# Patient Record
Sex: Male | Born: 1995 | Race: White | Hispanic: No | Marital: Single | Smoking: Never smoker
Health system: Southern US, Community
[De-identification: ages and names within clinical notes are randomized; demographics above are authoritative.]

## PROBLEM LIST (undated history)

## (undated) DIAGNOSIS — I10 Essential (primary) hypertension: Secondary | ICD-10-CM

## (undated) DIAGNOSIS — R030 Elevated blood-pressure reading, without diagnosis of hypertension: Secondary | ICD-10-CM

## (undated) HISTORY — DX: Essential (primary) hypertension: I10

## (undated) HISTORY — PX: KNEE SURGERY: SHX244

## (undated) HISTORY — DX: Elevated blood-pressure reading, without diagnosis of hypertension: R03.0

---
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
## 2016-04-27 DIAGNOSIS — H40023 Open angle with borderline findings, high risk, bilateral: Secondary | ICD-10-CM | POA: Diagnosis not present

## 2016-05-12 DIAGNOSIS — H40023 Open angle with borderline findings, high risk, bilateral: Secondary | ICD-10-CM | POA: Diagnosis not present

## 2016-06-03 DIAGNOSIS — M25562 Pain in left knee: Secondary | ICD-10-CM

## 2016-06-03 DIAGNOSIS — M6750 Plica syndrome, unspecified knee: Secondary | ICD-10-CM

## 2016-06-03 HISTORY — DX: Pain in left knee: M25.562

## 2016-06-03 HISTORY — DX: Plica syndrome, unspecified knee: M67.50

## 2016-06-24 DIAGNOSIS — M2242 Chondromalacia patellae, left knee: Secondary | ICD-10-CM

## 2016-06-24 HISTORY — DX: Chondromalacia patellae, left knee: M22.42

## 2017-04-25 DIAGNOSIS — H40023 Open angle with borderline findings, high risk, bilateral: Secondary | ICD-10-CM | POA: Diagnosis not present

## 2017-09-29 ENCOUNTER — Other Ambulatory Visit: Payer: Self-pay | Admitting: Family Medicine

## 2017-09-29 DIAGNOSIS — R030 Elevated blood-pressure reading, without diagnosis of hypertension: Secondary | ICD-10-CM | POA: Diagnosis not present

## 2017-09-29 DIAGNOSIS — I10 Essential (primary) hypertension: Secondary | ICD-10-CM

## 2017-09-29 DIAGNOSIS — Z6822 Body mass index (BMI) 22.0-22.9, adult: Secondary | ICD-10-CM | POA: Diagnosis not present

## 2017-10-11 DIAGNOSIS — I701 Atherosclerosis of renal artery: Secondary | ICD-10-CM | POA: Diagnosis not present

## 2017-10-17 ENCOUNTER — Ambulatory Visit
Admission: RE | Admit: 2017-10-17 | Discharge: 2017-10-17 | Disposition: A | Discharge status: Home / Self Care | Payer: BLUE CROSS/BLUE SHIELD | Source: Ambulatory Visit | Attending: Family Medicine | Admitting: Family Medicine

## 2017-10-17 ENCOUNTER — Other Ambulatory Visit: Payer: Self-pay

## 2017-10-17 DIAGNOSIS — I10 Essential (primary) hypertension: Secondary | ICD-10-CM

## 2017-10-17 DIAGNOSIS — I701 Atherosclerosis of renal artery: Secondary | ICD-10-CM | POA: Diagnosis not present

## 2017-10-21 ENCOUNTER — Telehealth: Payer: Self-pay | Admitting: Cardiovascular Disease

## 2017-10-21 ENCOUNTER — Encounter: Payer: Self-pay | Admitting: Cardiovascular Disease

## 2017-10-21 NOTE — Telephone Encounter (Signed)
Called pt and left message for pt to call back to update Family and Medical History.

## 2017-10-24 ENCOUNTER — Encounter: Payer: Self-pay | Admitting: Cardiovascular Disease

## 2017-10-24 ENCOUNTER — Ambulatory Visit (INDEPENDENT_AMBULATORY_CARE_PROVIDER_SITE_OTHER): Payer: BLUE CROSS/BLUE SHIELD | Admitting: Cardiovascular Disease

## 2017-10-24 VITALS — BP 140/78 | HR 48 | Ht 74.0 in | Wt 172.8 lb

## 2017-10-24 DIAGNOSIS — I10 Essential (primary) hypertension: Secondary | ICD-10-CM | POA: Diagnosis not present

## 2017-10-24 DIAGNOSIS — I119 Hypertensive heart disease without heart failure: Secondary | ICD-10-CM | POA: Diagnosis not present

## 2017-10-24 DIAGNOSIS — Z13220 Encounter for screening for lipoid disorders: Secondary | ICD-10-CM | POA: Diagnosis not present

## 2017-10-24 LAB — URINALYSIS
Bilirubin, UA: NEGATIVE
Glucose, UA: NEGATIVE
KETONES UA: NEGATIVE
LEUKOCYTES UA: NEGATIVE
Nitrite, UA: NEGATIVE
PH UA: 5.5 (ref 5.0–7.5)
PROTEIN UA: NEGATIVE
RBC UA: NEGATIVE
Urobilinogen, Ur: 1 mg/dL (ref 0.2–1.0)

## 2017-10-24 LAB — HEPATIC FUNCTION PANEL
ALBUMIN: 4.8 g/dL (ref 3.5–5.5)
ALT: 15 IU/L (ref 0–44)
AST: 26 IU/L (ref 0–40)
Alkaline Phosphatase: 92 IU/L (ref 39–117)
Bilirubin Total: 0.9 mg/dL (ref 0.0–1.2)
Bilirubin, Direct: 0.26 mg/dL (ref 0.00–0.40)
TOTAL PROTEIN: 7.6 g/dL (ref 6.0–8.5)

## 2017-10-24 LAB — CBC
HEMOGLOBIN: 15.1 g/dL (ref 13.0–17.7)
Hematocrit: 43.2 % (ref 37.5–51.0)
MCH: 31.7 pg (ref 26.6–33.0)
MCHC: 35 g/dL (ref 31.5–35.7)
MCV: 91 fL (ref 79–97)
Platelets: 297 10*3/uL (ref 150–450)
RBC: 4.77 x10E6/uL (ref 4.14–5.80)
RDW: 12.7 % (ref 12.3–15.4)
WBC: 5.7 10*3/uL (ref 3.4–10.8)

## 2017-10-24 LAB — LIPID PANEL
Chol/HDL Ratio: 2.3 ratio (ref 0.0–5.0)
Cholesterol, Total: 120 mg/dL (ref 100–199)
HDL: 53 mg/dL (ref >39)
LDL Calculated: 55 mg/dL (ref 0–99)
Triglycerides: 59 mg/dL (ref 0–149)
VLDL CHOLESTEROL CAL: 12 mg/dL (ref 5–40)

## 2017-10-24 LAB — BASIC METABOLIC PANEL
BUN/Creatinine Ratio: 21 — ABNORMAL HIGH (ref 9–20)
BUN: 20 mg/dL (ref 6–20)
CHLORIDE: 104 mmol/L (ref 96–106)
CO2: 23 mmol/L (ref 20–29)
Calcium: 9.9 mg/dL (ref 8.7–10.2)
Creatinine, Ser: 0.96 mg/dL (ref 0.76–1.27)
GFR calc Af Amer: 131 mL/min/{1.73_m2} (ref >59)
GFR calc non Af Amer: 113 mL/min/{1.73_m2} (ref >59)
GLUCOSE: 72 mg/dL (ref 65–99)
Potassium: 4.1 mmol/L (ref 3.5–5.2)
SODIUM: 143 mmol/L (ref 134–144)

## 2017-10-24 LAB — TSH: TSH: 1.26 u[IU]/mL (ref 0.450–4.500)

## 2017-10-24 NOTE — Patient Instructions (Addendum)
Medication Instructions:  Your physician recommends that you continue on your current medications as directed. Please refer to the Current Medication list given to you today.   Labwork: TODAY - cholesterol, liver panel, TSH, CBC, basic metabolic panel,urinalysis   Testing/Procedures: Your physician has requested that you have an echocardiogram. Echocardiography is a painless test that uses sound waves to create images of your heart. It provides your doctor with information about the size and shape of your heart and how well your heart's chambers and valves are working. This procedure takes approximately one hour. There are no restrictions for this procedure.   Follow-Up: Your physician recommends that you return for a follow-up appointment on Tuesday July 16 at 7:40 am with Dr. Elease HashimotoNahser   If you need a refill on your cardiac medications before your next appointment, please call your pharmacy.   Thank you for choosing [facility]! Eligha BridegroomMichelle Geeta Dworkin, RN [phone]

## 2017-10-24 NOTE — Progress Notes (Signed)
Cardiology Office Note:    Date:  10/24/2017   ID:  Jesus [name], DOB [birth year], MRN [id number]  PCP:  Lewis Moccasinewey, Elizabeth R, MD  Cardiologist:  Kristeen MissPhilip Teller Wakefield, MD   Referring MD: Lewis Moccasinewey, Elizabeth R, MD   Problem List: 1. HTN   Chief Complaint  Patient presents with  . Hypertension    History of Present Illness:    Jesus [name] is a 22 y.o. male with no significant past medical history.  He was recently found to have hypertension. 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 Able to run well  .  Eats an unrestricted diet .   Eats a fairly high salt diet .  Drinks gatoraid.    Does not take supplements  Limited caffiene,    Non smoker   He had a renal ultrasound which was normal.  Is not had any blood work. Has not had any blood work yet.  No symptoms of Pheo,   Or Seritonin excess No gout   Past Medical History:  Diagnosis Date  . Elevated blood pressure reading   . Hypertension     Past Surgical History:  Procedure Laterality Date  . KNEE SURGERY      Current Medications: No outpatient medications have been marked as taking for the 10/24/17 encounter (Office Visit) with Syona Wroblewski, Deloris PingPhilip J, MD.     Allergies:   Patient has no known allergies.   Social History   Socioeconomic History  . Marital status: Single    Spouse name: Not on file  . Number of children: Not on file  . Years of education: Not on file  . Highest education level: Not on file  Occupational History  . Not on file  Social Needs  . Financial resource strain: Not on file  . Food insecurity:    Worry: Not on file    Inability: Not on file  . Transportation needs:    Medical: Not on file    Non-medical: Not on file  Tobacco Use  . Smoking status: Never Smoker  . Smokeless tobacco: Never Used  Substance and Sexual Activity    . Alcohol use: Not on file  . Drug use: Not on file  . Sexual activity: Not on file  Lifestyle  . Physical activity:    Days per week: Not on file    Minutes per session: Not on file  . Stress: Not on file  Relationships  . Social connections:    Talks on phone: Not on file    Gets together: Not on file    Attends religious service: Not on file    Active member of club or organization: Not on file    Attends meetings of clubs or organizations: Not on file    Relationship status: Not on file  Other Topics Concern  . Not on file  Social History Narrative  . Not on file     Family History: The patient's family history includes Hypertension in his father.  ROS:   Please see the history of present illness.     All other systems reviewed and are negative.  EKGs/Labs/Other Studies Reviewed:      EKG:  October 24, 2017 - marked sinus brady with sinus arrhythmia   . HR 48  Recent Labs: 10/24/2017: ALT 15; BUN 20; Creatinine, Ser 0.96; Hemoglobin 15.1; Platelets 297; Potassium 4.1; Sodium 143; TSH 1.260  Recent Lipid Panel    Component Value Date/Time   CHOL 120 10/24/2017 1149   TRIG 59 10/24/2017 1149   HDL 53 10/24/2017 1149   CHOLHDL 2.3 10/24/2017 1149   LDLCALC 55 10/24/2017 1149    Physical Exam:    VS:  BP 140/78   Pulse (!) 48   Ht 6\' 2"  (1.88 m)   Wt 172 lb 12.8 oz (78.4 kg)   SpO2 98%   BMI 22.19 kg/m     Wt Readings from Last 3 Encounters:  10/24/17 172 lb 12.8 oz (78.4 kg)     GEN: healthy young male  HEENT: Normal NECK: No JVD; No carotid bruits LYMPHATICS: No lymphadenopathy CARDIAC: RR   RESPIRATORY:  Clear to auscultation without rales, wheezing or rhonchi  ABDOMEN: Soft, non-tender, non-distended MUSCULOSKELETAL:  No edema; No deformity  SKIN: Warm and dry NEUROLOGIC:  Alert and oriented x 3 PSYCHIATRIC:  Normal affect   ASSESSMENT:    1. Essential hypertension   2. 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 Hypertension: Jesus [name] is a healthy 22 year old gentleman.  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  He has had mildly elevated blood pressure and has had a little bit of difficulty in passing his physical exam to run track and cross-country.  A renal artery ultrasound which was normal.  He has not had any blood work or other work-up.  We will get initial blood work including CBC with differential, TSH, basic metabolic profile, liver enzymes and lipid profile.  We will also probably get a urinalysis.  He does not have any signs or symptoms of pheochromocytoma  He admits to eating a little bit more salt than he probably should.  We will have him limit his salt intake.  We will reassess in 3 to 4 weeks for follow-up visit.  I would like to get an echocardiogram to evaluate his LV size and function.  Will have him see me or an APP in 3-4 weeks for follow up visit    Medication Adjustments/Labs and Tests Ordered: Current medicines are reviewed at length with the patient today.  Concerns regarding medicines are outlined above.  Orders Placed This Encounter  Procedures  . Urinalysis  . Basic Metabolic Panel (BMET)  . Hepatic function panel  . CBC  . Lipid Profile  . TSH  . EKG 12-Lead  . ECHOCARDIOGRAM COMPLETE   No orders of the defined types were placed in this encounter.    Patient Instructions  Medication Instructions:  Your physician recommends that you continue on your current medications as directed. Please refer to the Current Medication list given to you today.   Labwork: TODAY - cholesterol, liver panel, TSH, CBC, basic metabolic panel,urinalysis   Testing/Procedures: Your physician has requested that you have an echocardiogram. Echocardiography is a painless test that uses sound waves to create images of your heart. It provides your doctor with information about the size and shape  of your heart and how well your heart's chambers and valves are working. This procedure takes approximately one hour. There are no restrictions for this procedure.   Follow-Up: Your physician recommends that you return for a follow-up appointment on Tuesday July 16 at 7:40 am with Dr. Elease Hashimoto  If you need a refill on your cardiac medications before your next appointment, please call your pharmacy.   Thank you for choosing [facility]! Eligha Bridegroom, RN [phone]       Signed, Kristeen Miss, MD  10/24/2017 8:26 PM    [facility]

## 2017-10-25 NOTE — Addendum Note (Signed)
Addended by: Levi AlandSWINYER, Nate Perri M on: 10/25/2017 01:17 PM   Modules accepted: Orders

## 2017-10-27 ENCOUNTER — Other Ambulatory Visit: Payer: Self-pay

## 2017-10-27 ENCOUNTER — Ambulatory Visit (HOSPITAL_COMMUNITY): Payer: BLUE CROSS/BLUE SHIELD | Attending: Cardiology

## 2017-10-27 DIAGNOSIS — I340 Nonrheumatic mitral (valve) insufficiency: Secondary | ICD-10-CM | POA: Diagnosis not present

## 2017-10-27 DIAGNOSIS — Z8249 Family history of ischemic heart disease and other diseases of the circulatory system: Secondary | ICD-10-CM | POA: Insufficient documentation

## 2017-10-27 DIAGNOSIS — I119 Hypertensive heart disease without heart failure: Secondary | ICD-10-CM | POA: Insufficient documentation

## 2017-11-22 ENCOUNTER — Encounter: Payer: Self-pay | Admitting: Cardiovascular Disease

## 2017-11-22 ENCOUNTER — Encounter (INDEPENDENT_AMBULATORY_CARE_PROVIDER_SITE_OTHER): Payer: Self-pay

## 2017-11-22 ENCOUNTER — Ambulatory Visit (INDEPENDENT_AMBULATORY_CARE_PROVIDER_SITE_OTHER): Payer: BLUE CROSS/BLUE SHIELD | Admitting: Cardiovascular Disease

## 2017-11-22 VITALS — BP 146/72 | HR 63 | Ht 74.0 in | Wt 172.0 lb

## 2017-11-22 DIAGNOSIS — I10 Essential (primary) hypertension: Secondary | ICD-10-CM

## 2017-11-22 MED ORDER — LOSARTAN POTASSIUM 50 MG PO TABS: 50.0000 mg | ORAL_TABLET | Freq: Every day | ORAL | 3 refills | Status: DC

## 2017-11-22 NOTE — Patient Instructions (Addendum)
Medication Instructions:  Your physician has recommended you make the following change in your medication:   START Losartan 50 mg once daily (start with 1/2 tab - 25 mg - then increase to 50 mg)   Labwork: None Ordered   Testing/Procedures: None Ordered   Follow-Up: Your physician recommends that you schedule a follow-up appointment in: 3 months with Dr. Elease HashimotoNahser - call to schedule based on school schedule  Your physician recommends that you return for a follow-up appointment for BP check on Monday August 12 at 11:00 am   If you need a refill on your cardiac medications before your next appointment, please call your pharmacy.   Thank you for choosing [facility]! Eligha BridegroomMichelle Swinyer, RN [phone]

## 2017-11-22 NOTE — Progress Notes (Signed)
Cardiology Office Note:    Date:  11/22/2017   ID:  Crecencio McFrederick W III Keiffer, DOB [birth year], MRN [id number]  PCP:  Lewis Moccasinewey, Elizabeth R, MD  Cardiologist:  Kristeen MissPhilip Donika Butner, MD   Referring MD: Lewis Moccasinewey, Elizabeth R, MD   Problem List: 1. HTN   Chief Complaint  Patient presents with  . Hypertension        Crecencio McFrederick W III Mcclure is a 22 y.o. male with no significant past medical history.  He was recently found to have hypertension. 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 Able to run well  .  Eats an unrestricted diet .   Eats a fairly high salt diet .  Drinks gatoraid.    Does not take supplements  Limited caffiene,    Non smoker   He had a renal ultrasound which was normal.  Is not had any blood work. Has not had any blood work yet.  No symptoms of Pheo,   Or Seritonin excess No gout   November 22, 2017:  Merlyn AlbertFred is seen today for follow up of his HTN I saw him last month.  We did not start any new medications.  We had him keep track of his blood pressure and to really watch his diet.  Doing well Drinking more water BP at home 120's - 140's  No headaches      Past Medical History:  Diagnosis Date  . Elevated blood pressure reading   . Hypertension     Past Surgical History:  Procedure Laterality Date  . KNEE SURGERY      Current Medications: No outpatient medications have been marked as taking for the 11/22/17 encounter (Office Visit) with Corey Laski, Deloris PingPhilip J, MD.     Allergies:   Patient has no known allergies.   Social History   Socioeconomic History  . Marital status: Single    Spouse name: Not on file  . Number of children: Not on file  . Years of education: Not on file  . Highest education level: Not on file  Occupational History  . Not on file  Social Needs  . Financial resource strain: Not on file  .  Food insecurity:    Worry: Not on file    Inability: Not on file  . Transportation needs:    Medical: Not on file    Non-medical: Not on file  Tobacco Use  . Smoking status: Never Smoker  . Smokeless tobacco: Never Used  Substance and Sexual Activity  . Alcohol use: Not on file  . Drug use: Not on file  . Sexual activity: Not on file  Lifestyle  . Physical activity:    Days per week: Not on file    Minutes per session: Not on file  . Stress: Not on file  Relationships  . Social connections:    Talks on phone: Not on file    Gets together: Not on file    Attends religious service: Not on file    Active member of club or organization: Not on file    Attends meetings of clubs or organizations: Not on file    Relationship status: Not on file  Other Topics Concern  . Not on file  Social History Narrative  . Not on file     Family  History: The patient's family history includes Hypertension in his father.  ROS:   Please see the history of present illness.     All other systems reviewed and are negative.  EKGs/Labs/Other Studies Reviewed:      EKG:     Recent Labs: 10/24/2017: ALT 15; BUN 20; Creatinine, Ser 0.96; Hemoglobin 15.1; Platelets 297; Potassium 4.1; Sodium 143; TSH 1.260  Recent Lipid Panel    Component Value Date/Time   CHOL 120 10/24/2017 1149   TRIG 59 10/24/2017 1149   HDL 53 10/24/2017 1149   CHOLHDL 2.3 10/24/2017 1149   LDLCALC 55 10/24/2017 1149    Physical Exam:    Physical Exam: Blood pressure (!) 146/72, pulse 63, height 6\' 2"  (1.88 m), weight 172 lb (78 kg), SpO2 98 %.  GEN:  Thin, young man .  Well nourished, well developed in no acute distress HEENT: Normal NECK: No JVD; No carotid bruits LYMPHATICS: No lymphadenopathy CARDIAC: RR  RESPIRATORY:  Clear to auscultation without rales, wheezing or rhonchi  ABDOMEN: Soft, non-tender, non-distended MUSCULOSKELETAL:  No edema; No deformity  SKIN: Warm and dry NEUROLOGIC:  Alert and  oriented x 3   ASSESSMENT:    1. Essential hypertension    PLAN:       1. Hypertension: Rudell Cobb is a healthy 22 year old gentleman.  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  He has had mildly elevated blood pressure and has had a little bit of difficulty in passing his physical exam to run track and cross-country.  A renal artery ultrasound which was normal.  He has not had any blood work or other work-up.  Will start Losartan 50 mg a day  - he will start with 1/2 tablet a day  Record BP daily  nusrse visit and BMP in 3 weeks  Will see him in 3 months or so ( when he is home on a school break)     Medication Adjustments/Labs and Tests Ordered: Current medicines are reviewed at length with the patient today.  Concerns regarding medicines are outlined above.  Orders Placed This Encounter  Procedures  . Basic Metabolic Panel (BMET)   Meds ordered this encounter  Medications  . losartan (COZAAR) 50 MG tablet    Sig: Take 1 tablet (50 mg total) by mouth daily.    Dispense:  90 tablet    Refill:  3      Patient Instructions  Medication Instructions:  Your physician has recommended you make the following change in your medication:   START Losartan 50 mg once daily (start with 1/2 tab - 25 mg - then increase to 50 mg)   Labwork: None Ordered   Testing/Procedures: None Ordered   Follow-Up: Your physician recommends that you schedule a follow-up appointment in: 3 months with Dr. Elease Hashimoto - call to schedule based on school schedule  Your physician recommends that you return for a follow-up appointment for BP check on Thursday August 8 at 11:00 am   If you need a refill on your cardiac medications before your next appointment, please call your pharmacy.   Thank you for choosing [facility]! Eligha Bridegroom, RN [phone]       Signed, Kristeen Miss, MD  11/22/2017 8:05 AM    [facility]

## 2017-11-23 ENCOUNTER — Telehealth: Payer: Self-pay | Admitting: Cardiovascular Disease

## 2017-11-23 NOTE — Telephone Encounter (Signed)
Spoke with pt's father who states pt was c/o of chest tightness. Pt started his new rx of Losartan last night. He states his son denies SOB, dizziness, diaphoresis, and fatigue. His father states it could be muscle related; his son ran 9 miles yesterday in the heat. I encouraged pt's father to be sure his son stays hydrated and take a tylenol to see if it helps to relieve his chest tightness. I urged pt's father to seek medical attention for his son if pt has sustained CP not relieved by rest. Pt's father agreed to plan and verbalized understanding.

## 2017-11-23 NOTE — Telephone Encounter (Signed)
New Message     Pt c/o of Chest Pain: STAT if CP now or developed within 24 hours  1. Are you having CP right now? No, more of a tightness/Per patient's father  2. Are you experiencing any other symptoms (ex. SOB, nausea, vomiting, sweating)? No/per patient father 3. How long have you been experiencing CP? Last night/per patient's father  4. Is your CP continuous or coming and going? No, patient ran 9 miles last night/per patient's father  5. Have you taken Nitroglycerin? No, per patient's father.   Patient was prescribe "Losartan" Patient's father is calling and he states b/p was good 118/62 this morning. Patient is currently at work.  ?

## 2017-11-23 NOTE — Telephone Encounter (Signed)
Agree that this is likely exercise, / heat related.  Record BP daily . Call us or 911 if CP persists

## 2017-11-24 NOTE — Telephone Encounter (Signed)
Called patient to see how he is feeling today and he reports he is feeling better. He states he does not have any chest pain today. I advised him to continue with good diet, and to be especially mindful when he is running long distances. He verbalized understanding and thanked me for the call.

## 2017-12-15 ENCOUNTER — Ambulatory Visit: Payer: BLUE CROSS/BLUE SHIELD

## 2017-12-19 ENCOUNTER — Other Ambulatory Visit: Payer: BLUE CROSS/BLUE SHIELD | Admitting: *Deleted

## 2017-12-19 ENCOUNTER — Ambulatory Visit (INDEPENDENT_AMBULATORY_CARE_PROVIDER_SITE_OTHER): Payer: BLUE CROSS/BLUE SHIELD | Admitting: Nurse Practitioner

## 2017-12-19 VITALS — BP 128/72 | HR 44 | Ht 74.0 in | Wt 168.5 lb

## 2017-12-19 DIAGNOSIS — I10 Essential (primary) hypertension: Secondary | ICD-10-CM

## 2017-12-19 LAB — BASIC METABOLIC PANEL
BUN / CREAT RATIO: 18 (ref 9–20)
BUN: 17 mg/dL (ref 6–20)
CO2: 25 mmol/L (ref 20–29)
CREATININE: 0.95 mg/dL (ref 0.76–1.27)
Calcium: 9.8 mg/dL (ref 8.7–10.2)
Chloride: 101 mmol/L (ref 96–106)
GFR calc non Af Amer: 115 mL/min/{1.73_m2} (ref >59)
GFR, EST AFRICAN AMERICAN: 133 mL/min/{1.73_m2} (ref >59)
GLUCOSE: 85 mg/dL (ref 65–99)
Potassium: 4.4 mmol/L (ref 3.5–5.2)
Sodium: 139 mmol/L (ref 134–144)

## 2017-12-19 NOTE — Progress Notes (Signed)
1.) Reason for visit: BP check  2.) Name of MD requesting visit: Dr. Elease HashimotoNahser  3.) H&P: Patient presents for BP check since starting Losartan 7/16. Patient was advised to start Losartan 25 mg for a few days and then increase dose to 50 mg. He is also here for BMET  4.) ROS related to problem: Patient reports he has continue Losartan 25 mg and is getting BP readings at home of 110-130 mmHg systolic. He states he feels well with is vigorous running routine. He denies concerns  5.) Assessment and plan per MD: Continue Losartan 25 mg and monitor BP regularly. Patient states he has a BP monitor which he will take to school with him. He is returning to college on Friday. Advised him he may increase Losartan to 50 mg daily and call back to report. Will call patient with results of BMET and will adjust plan of care if there are abnormal findings.

## 2017-12-19 NOTE — Patient Instructions (Addendum)
Medication Instructions:  Your physician recommends that you continue on your current medications as directed. Please refer to the Current Medication list given to you today.   Labwork: Done Today - will call you with results   Testing/Procedures: None Ordered   Follow-Up: Your physician recommends that you schedule a follow-up appointment in: 3 months with Dr. Elease HashimotoNahser - patient will call to schedule based on Thanksgiving Break  If you need a refill on your cardiac medications before your next appointment, please call your pharmacy.   Thank you for choosing [facility]! Eligha BridegroomMichelle Fannie Gathright, RN [phone]

## 2018-12-27 ENCOUNTER — Other Ambulatory Visit: Payer: Self-pay | Admitting: Cardiovascular Disease

## 2018-12-27 MED ORDER — LOSARTAN POTASSIUM 25 MG PO TABS: 25.0000 mg | ORAL_TABLET | Freq: Every day | ORAL | 0 refills | Status: DC

## 2018-12-27 NOTE — Telephone Encounter (Signed)
Pt's medication was sent to pt's pharmacy as requested. Confirmation received.  °

## 2019-01-19 ENCOUNTER — Other Ambulatory Visit: Payer: Self-pay | Admitting: Cardiovascular Disease

## 2019-02-14 DIAGNOSIS — M7662 Achilles tendinitis, left leg: Secondary | ICD-10-CM | POA: Insufficient documentation

## 2019-02-14 HISTORY — DX: Achilles tendinitis, left leg: M76.62

## 2019-03-29 NOTE — Progress Notes (Signed)
Cardiology Office Note:    Date:  04/03/2019   ID:  Jesus [name], DOB [birth year], MRN [id number]  PCP:  Lewis Moccasinewey, Elizabeth R, MD  Cardiologist:  Kristeen MissPhilip Nahser, MD   Referring MD: Lewis Moccasinewey, Elizabeth R, MD   Problem List: 1. HTN   No chief complaint on file.       Jesus [name] is a 23 y.o. male with no significant past medical history.  He was recently found to have hypertension. 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 Able to run well  .  Eats an unrestricted diet .   Eats a fairly high salt diet .  Drinks gatoraid.    Does not take supplements  Limited caffiene,    Non smoker   He had a renal ultrasound which was normal.  Is not had any blood work. Has not had any blood work yet.  No symptoms of Pheo,   Or Seritonin excess No gout   November 22, 2017:  Jesus [name] is seen today for follow up of his HTN I saw him last month.  We did not start any new medications.  We had him keep track of his blood pressure and to really watch his diet.  Doing well Drinking more water BP at home 120's - 140's  No headaches  Nov. 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 Still running CC. No CP , no sncope  BP is a little high today  Usually in the 140 range .  Avoids salt for the most part  Strong family hx of HTN   Past Medical History:  Diagnosis Date  . Elevated blood pressure reading   . Hypertension     Past Surgical History:  Procedure Laterality Date  . KNEE SURGERY      Current Medications: Current Meds  Medication Sig  . losartan (COZAAR) 25 MG tablet Take 50 mg by mouth daily.     Allergies:   Patient has no known allergies.   Social History   Socioeconomic History  . Marital status: Single     Spouse name: Not on file  . Number of children: Not on file  . Years of education: Not on file  . Highest education level: Not on file  Occupational History  . Not on file  Social Needs  . Financial resource strain: Not on file  . Food insecurity    Worry: Not on file    Inability: Not on file  . Transportation needs    Medical: Not on file    Non-medical: Not on file  Tobacco Use  . Smoking status: Never Smoker  . Smokeless tobacco: Never Used  Substance and Sexual Activity  . Alcohol Use    Frequency: Never  . Drug use: Not on file  . Sexual activity: Not on file  Lifestyle  . Physical activity    Days per week: Not on file    Minutes per session: Not on file  . Stress: Not on file  Relationships  . Social Musicianconnections    Talks on phone: Not on file    Gets together: Not on file  Attends religious service: Not on file    Active member of club or organization: Not on file    Attends meetings of clubs or organizations: Not on file    Relationship status: Not on file  Other Topics Concern  . Not on file  Social History Narrative  . Not on file     Family History: The patient's family history includes Hypertension in his father.  ROS:   Please see the history of present illness.     All other systems reviewed and are negative.  EKGs/Labs/Other Studies Reviewed:      EKG:   April 03, 2019: Sinus bradycardia 58 beats minute.  Voltage criteria for left ventricular hypertrophy.  Recent Labs: No results found for requested labs within last 8760 hours.  Recent Lipid Panel    Component Value Date/Time   CHOL 120 10/24/2017 1149   TRIG 59 10/24/2017 1149   HDL 53 10/24/2017 1149   CHOLHDL 2.3 10/24/2017 1149   LDLCALC 55 10/24/2017 1149    Physical Exam:    Physical Exam: Blood pressure (!) 148/92, pulse (!) 58, height 6\' 2"  (1.88 m), weight 180 lb 12.8 oz (82 kg), SpO2 98 %.  GEN:  Well nourished, well developed in no acute distress HEENT: Normal  NECK: No JVD; No carotid bruits LYMPHATICS: No lymphadenopathy CARDIAC: RRR ,  Soft systolic murmur  RESPIRATORY:  Clear to auscultation without rales, wheezing or rhonchi  ABDOMEN: Soft, non-tender, non-distended MUSCULOSKELETAL:  No edema; No deformity  SKIN: Warm and dry NEUROLOGIC:  Alert and oriented x 3    ASSESSMENT:    1. Essential hypertension    PLAN:       1. Hypertension:  A renal artery ultrasound which was normal.  He has not had any blood work or other work-up.  Blood pressure remains mildly elevated.  He increased the losartan from 25 mg to 50 mg but his diastolic blood pressures remain elevated.  We will add hydrochlorothiazide 25 mg a day as well as potassium chloride 20 mEq a day.  We will see him back in the office for a quick office visit or nurse visit to check blood pressure and a basic metabolic profile at that time.  I will plan on seeing him again in 6 months.  He is got a job in [place] working for [employer] firm and will be moving next spring.  2.  Heart murmur: He has a soft heart murmur on exam.  He has mild mitral regurgitation and trace tricuspid regurgitation.      Medication Adjustments/Labs and Tests Ordered: Current medicines are reviewed at length with the patient today.  Concerns regarding medicines are outlined above.  Orders Placed This Encounter  Procedures  . EKG 12-Lead   Meds ordered this encounter  Medications  . hydrochlorothiazide (HYDRODIURIL) 25 MG tablet    Sig: Take 1 tablet (25 mg total) by mouth daily.    Dispense:  30 tablet    Refill:  11  . potassium chloride SA (KLOR-CON) 20 MEQ tablet    Sig: Take 1 tablet (20 mEq total) by mouth daily.    Dispense:  30 tablet    Refill:  11      Patient Instructions  Medication Instructions:  1) START HCTZ (hydrocholorothiazide) 25 mg daily 2) START KDUR 20 meq daily  Labwork: You will have labs drawn when you return for your visit in 3 weeks.  Follow-Up: You  have an appointment with Dr. Acie Fredrickson on December  16 at 11:40AM.     Signed, Kristeen Miss, MD  04/03/2019 5:28 PM    [facility]

## 2019-04-03 ENCOUNTER — Ambulatory Visit (INDEPENDENT_AMBULATORY_CARE_PROVIDER_SITE_OTHER): Payer: BC Managed Care – PPO | Admitting: Cardiovascular Disease

## 2019-04-03 ENCOUNTER — Encounter: Payer: Self-pay | Admitting: Cardiovascular Disease

## 2019-04-03 ENCOUNTER — Other Ambulatory Visit: Payer: Self-pay

## 2019-04-03 VITALS — BP 148/92 | HR 58 | Ht 74.0 in | Wt 180.8 lb

## 2019-04-03 DIAGNOSIS — I10 Essential (primary) hypertension: Secondary | ICD-10-CM | POA: Diagnosis not present

## 2019-04-03 MED ORDER — POTASSIUM CHLORIDE CRYS ER 20 MEQ PO TBCR: 20.0000 meq | EXTENDED_RELEASE_TABLET | Freq: Every day | ORAL | 11 refills | Status: DC

## 2019-04-03 MED ORDER — HYDROCHLOROTHIAZIDE 25 MG PO TABS: 25.0000 mg | ORAL_TABLET | Freq: Every day | ORAL | 11 refills | Status: DC

## 2019-04-03 NOTE — Patient Instructions (Addendum)
Medication Instructions:  1) START HCTZ (hydrocholorothiazide) 25 mg daily 2) START KDUR 20 meq daily  Labwork: You will have labs drawn when you return for your visit in 3 weeks.  Follow-Up: You have an appointment with Dr. Acie Fredrickson on December 16 at 11:40AM.

## 2019-04-20 ENCOUNTER — Other Ambulatory Visit: Payer: Self-pay

## 2019-04-20 DIAGNOSIS — Z20822 Contact with and (suspected) exposure to covid-19: Secondary | ICD-10-CM

## 2019-04-21 LAB — NOVEL CORONAVIRUS, NAA: SARS-CoV-2, NAA: NOT DETECTED

## 2019-04-25 ENCOUNTER — Ambulatory Visit: Payer: BC Managed Care – PPO | Admitting: Cardiovascular Disease

## 2019-05-07 ENCOUNTER — Encounter: Payer: Self-pay | Admitting: *Deleted

## 2019-05-07 DIAGNOSIS — R030 Elevated blood-pressure reading, without diagnosis of hypertension: Secondary | ICD-10-CM | POA: Insufficient documentation

## 2019-05-15 NOTE — Progress Notes (Signed)
Cardiology Office Note:    Date:  05/16/2019   ID:  [name], DOB [birth year], MRN [id number]  PCP:  Lewis Moccasin, MD  Cardiologist:  Kristeen Miss, MD  Electrophysiologist:  None   Referring MD: Lewis Moccasin, MD   Chief Complaint  Patient presents with  . Follow-up    HTN    History of Present Illness:    Jesus [name] is a 24 y.o. male with hypertension.  He was last seen by Dr. Elease Hashimoto 04/03/2019.  His BP was uncontrolled and he was started on HCTZ.  He returns for follow up.     He is doing well.  He is tolerating HCTZ without side effects.  He has not had headaches, blurry vision, difficulty with urination, shortness of breath.  He had a little chest pain right after starting HCTZ, but has not had any further.  He has not had any swelling.       Prior CV studies:   The following studies were reviewed today:  Echocardiogram 10/27/17 EF 55-60, no RWMA, normal diastolic function, mild MR, trivial TR  RA [place] 10/17/17 IMPRESSION: 1. No evidence of hemodynamically significant renal arterial stenosis. 2. Normal sonographic appearance of the kidneys and bladder.  Past Medical History:  Diagnosis Date  . Acute pain of left knee 06/03/2016  . 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 Elevated blood pressure reading   . Hypertension   . Left Achilles tendinitis 02/14/2019  . 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   Current Medications: Current Meds  Medication Sig  . hydrochlorothiazide (HYDRODIURIL) 25 MG tablet Take 1 tablet (25 mg total) by mouth daily.  Marland Kitchen losartan (COZAAR) 50 MG tablet Take 1 tablet (50 mg total) by mouth daily.  . potassium chloride SA (KLOR-CON) 20 MEQ tablet Take 1 tablet (20 mEq total) by mouth daily.  . [DISCONTINUED] hydrochlorothiazide (HYDRODIURIL) 25 MG tablet Take 1 tablet (25 mg total) by mouth daily.  . [DISCONTINUED] losartan (COZAAR) 50 MG  tablet Take 50 mg by mouth daily.  . [DISCONTINUED] potassium chloride SA (KLOR-CON) 20 MEQ tablet Take 1 tablet (20 mEq total) by mouth daily.     Allergies:   Patient has no known allergies.   Social History   Tobacco Use  . Smoking status: Never Smoker  . Smokeless tobacco: Never Used  Substance Use Topics  . Alcohol use: Not on file  . Drug use: Never     Family Hx: The patient's family history includes Hypertension in his father.  ROS:   Please see the history of present illness.    ROS All other systems reviewed and are negative.   EKGs/Labs/Other Test Reviewed:    EKG:  EKG is not ordered today.  The ekg ordered today demonstrates n/a  Recent Labs: No results found for requested labs within last 8760 hours.   Recent Lipid Panel Lab Results  Component Value Date/Time   CHOL 120 10/24/2017 11:49 AM   TRIG 59 10/24/2017 11:49 AM   HDL 53 10/24/2017 11:49 AM   CHOLHDL 2.3 10/24/2017 11:49 AM   LDLCALC 55 10/24/2017 11:49 AM    Physical Exam:    VS:  BP 120/80   Pulse (!) 59   Ht 6\' 2"  (1.88 m)   Wt 180 lb (81.6 kg)   SpO2 98%   BMI 23.11 kg/m     Wt Readings from Last  3 Encounters:  05/16/19 180 lb (81.6 kg)  04/03/19 180 lb 12.8 oz (82 kg)  12/19/17 168 lb 8 oz (76.4 kg)     Physical Exam  Constitutional: He is oriented to person, place, and time. He appears well-developed and well-nourished. No distress.  HENT:  Head: Normocephalic and atraumatic.  Neck: No JVD present.  Cardiovascular: Normal rate, regular rhythm, S1 normal and S2 normal.  No murmur heard. Pulmonary/Chest: Breath sounds normal. He has no rales.  Abdominal: Soft. There is no hepatomegaly.  Musculoskeletal:        General: No edema.     Cervical back: Neck supple.  Neurological: He is alert and oriented to person, place, and time.  Skin: Skin is warm and dry.    ASSESSMENT & PLAN:    1. Essential hypertension BP is optimal now on Losartan and HCTZ.  Continue current Rx.   He continues to try to limit salt and has been drinking more water.  He leaves for school next week and will be moving to [place] after graduation.  He will follow up with Dr. Acie Fredrickson before he moves.  Obtain BMET today.     Dispo:  Return in about 5 months (around 10/14/2019) for Routine Follow Up, w/ Dr. Acie Fredrickson.   Medication Adjustments/Labs and Tests Ordered: Current medicines are reviewed at length with the patient today.  Concerns regarding medicines are outlined above.  Tests Ordered: Orders Placed This Encounter  Procedures  . Basic Metabolic Panel (BMET)   Medication Changes: Meds ordered this encounter  Medications  . losartan (COZAAR) 50 MG tablet    Sig: Take 1 tablet (50 mg total) by mouth daily.    Dispense:  90 tablet    Refill:  3  . hydrochlorothiazide (HYDRODIURIL) 25 MG tablet    Sig: Take 1 tablet (25 mg total) by mouth daily.    Dispense:  90 tablet    Refill:  3  . potassium chloride SA (KLOR-CON) 20 MEQ tablet    Sig: Take 1 tablet (20 mEq total) by mouth daily.    Dispense:  90 tablet    Refill:  3    Signed, Richardson Dopp, PA-C  05/16/2019 9:18 AM    [facility] [address], [place], [place]  [address] Phone: [phone]; Fax: [phone]

## 2019-05-16 ENCOUNTER — Ambulatory Visit (INDEPENDENT_AMBULATORY_CARE_PROVIDER_SITE_OTHER): Payer: BC Managed Care – PPO | Admitting: Physician Assistant

## 2019-05-16 ENCOUNTER — Other Ambulatory Visit: Payer: Self-pay

## 2019-05-16 ENCOUNTER — Encounter: Payer: Self-pay | Admitting: Physician Assistant

## 2019-05-16 ENCOUNTER — Other Ambulatory Visit: Payer: Self-pay | Admitting: Cardiovascular Disease

## 2019-05-16 VITALS — BP 120/80 | HR 59 | Ht 74.0 in | Wt 180.0 lb

## 2019-05-16 DIAGNOSIS — I10 Essential (primary) hypertension: Secondary | ICD-10-CM | POA: Diagnosis not present

## 2019-05-16 LAB — BASIC METABOLIC PANEL
BUN/Creatinine Ratio: 22 — ABNORMAL HIGH (ref 9–20)
BUN: 22 mg/dL — ABNORMAL HIGH (ref 6–20)
CO2: 24 mmol/L (ref 20–29)
Calcium: 10.2 mg/dL (ref 8.7–10.2)
Chloride: 96 mmol/L (ref 96–106)
Creatinine, Ser: 0.99 mg/dL (ref 0.76–1.27)
GFR calc Af Amer: 124 mL/min/{1.73_m2} (ref >59)
GFR calc non Af Amer: 108 mL/min/{1.73_m2} (ref >59)
Glucose: 106 mg/dL — ABNORMAL HIGH (ref 65–99)
Potassium: 4 mmol/L (ref 3.5–5.2)
Sodium: 135 mmol/L (ref 134–144)

## 2019-05-16 MED ORDER — HYDROCHLOROTHIAZIDE 25 MG PO TABS: 25.0000 mg | ORAL_TABLET | Freq: Every day | ORAL | 3 refills | Status: DC

## 2019-05-16 MED ORDER — LOSARTAN POTASSIUM 50 MG PO TABS: 50.0000 mg | ORAL_TABLET | Freq: Every day | ORAL | 3 refills | Status: DC

## 2019-05-16 MED ORDER — POTASSIUM CHLORIDE CRYS ER 20 MEQ PO TBCR: 20.0000 meq | EXTENDED_RELEASE_TABLET | Freq: Every day | ORAL | 3 refills | Status: DC

## 2019-05-16 NOTE — Patient Instructions (Signed)
Medication Instructions:   Your physician recommends that you continue on your current medications as directed. Please refer to the Current Medication list given to you today.  *If you need a refill on your cardiac medications before your next appointment, please call your pharmacy*  Lab Work:  You will have labs drawn today: BMET  If you have labs (blood work) drawn today and your tests are completely normal, you will receive your results only by: Marland Kitchen MyChart Message (if you have MyChart) OR . A paper copy in the mail If you have any lab test that is abnormal or we need to change your treatment, we will call you to review the results.  Testing/Procedures:  None ordered today  Follow-Up: At [facility], you and your health needs are our priority.  As part of our continuing mission to provide you with exceptional heart care, we have created designated Provider Care Teams.  These Care Teams include your primary Cardiologist (physician) and Advanced Practice Providers (APPs -  Physician Assistants and Nurse Practitioners) who all work together to provide you with the care you need, when you need it.  Your next appointment:    On 10/12/19 at 8:00AM with Kristeen Miss, MD

## 2019-05-17 DIAGNOSIS — Z20828 Contact with and (suspected) exposure to other viral communicable diseases: Secondary | ICD-10-CM | POA: Diagnosis not present

## 2019-05-17 DIAGNOSIS — Z03818 Encounter for observation for suspected exposure to other biological agents ruled out: Secondary | ICD-10-CM | POA: Diagnosis not present

## 2019-10-12 ENCOUNTER — Ambulatory Visit: Payer: BC Managed Care – PPO | Admitting: Cardiovascular Disease

## 2019-10-30 ENCOUNTER — Other Ambulatory Visit: Payer: Self-pay

## 2019-10-30 ENCOUNTER — Ambulatory Visit (INDEPENDENT_AMBULATORY_CARE_PROVIDER_SITE_OTHER): Payer: BC Managed Care – PPO | Admitting: Cardiovascular Disease

## 2019-10-30 ENCOUNTER — Encounter: Payer: Self-pay | Admitting: Cardiovascular Disease

## 2019-10-30 VITALS — BP 132/64 | HR 50 | Ht 74.0 in | Wt 179.6 lb

## 2019-10-30 DIAGNOSIS — I10 Essential (primary) hypertension: Secondary | ICD-10-CM | POA: Diagnosis not present

## 2019-10-30 DIAGNOSIS — I340 Nonrheumatic mitral (valve) insufficiency: Secondary | ICD-10-CM | POA: Diagnosis not present

## 2019-10-30 NOTE — Progress Notes (Signed)
Cardiology Office Note:    Date:  10/30/2019   ID:  Jesus [name], DOB Apr 25, 1996, MRN [id number]  PCP:  Lewis Moccasin, MD  Cardiologist:  Kristeen Miss, MD   Referring MD: Lewis Moccasin, MD   Problem List: 1. HTN   Chief Complaint  Patient presents with  . Hypertension        Jesus [name] is a 24 y.o. male with no significant past medical history.  He was recently found to have hypertension. 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 Able to run well  .  Eats an unrestricted diet .   Eats a fairly high salt diet .  Drinks gatoraid.    Does not take supplements  Limited caffiene,    Non smoker   He had a renal ultrasound which was normal.  Is not had any blood work. Has not had any blood work yet.  No symptoms of Pheo,   Or Seritonin excess No gout   November 22, 2017:  Jesus [name] is seen today for follow up of his HTN I saw him last month.  We did not start any new medications.  We had him keep track of his blood pressure and to really watch his diet.  Doing well Drinking more water BP at home 120's - 140's  No headaches  Nov. 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 Still running CC. No CP , no sncope  BP is a little high today  Usually in the 140 range .  Avoids salt for the most part  Strong family hx of HTN  October 30, 2019: Jesus [name] is seen today for follow-up visit.  He has a history of hypertension. No CP , no dyspnea  Still active Moving to [name] next week .  Works for [employer] .    Past Medical History:  Diagnosis Date  . Acute pain of left knee 06/03/2016  . 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 Elevated blood pressure reading   . Hypertension   .  Left Achilles tendinitis 02/14/2019  . 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 KNEE SURGERY      Current Medications: Current Meds  Medication Sig  . hydrochlorothiazide (HYDRODIURIL) 25 MG tablet Take 1 tablet (25 mg total) by mouth daily.  Marland Kitchen losartan (COZAAR) 50 MG tablet Take 1 tablet (50 mg total) by mouth daily.  . potassium chloride SA (KLOR-CON) 20 MEQ tablet Take 1 tablet (20 mEq total) by mouth daily.     Allergies:   Patient has no known allergies.   Social History   Socioeconomic History  . Marital status: Single    Spouse name: Not on file  . Number of children: Not on file  . Years of education: Not on file  . Highest education level: Not on file  Occupational History  . Not on file  Tobacco Use  . Smoking status: Never Smoker  . Smokeless tobacco: Never Used  Vaping Use  . Vaping Use: Never used  Substance and Sexual Activity  . Alcohol use: Not on file  .  Drug use: Never  . Sexual activity: Not on file  Other Topics Concern  . Not on file  Social History Narrative  . Not on file   Social Determinants of Health   Financial Resource Strain:   . Difficulty of Paying Living Expenses:   Food Insecurity:   . Worried About Charity fundraiser in the Last Year:   . Arboriculturist in the Last Year:   Transportation Needs:   . Film/video editor (Medical):   Marland Kitchen Lack of Transportation (Non-Medical):   Physical Activity:   . Days of Exercise per Week:   . Minutes of Exercise per Session:   Stress:   . Feeling of Stress :   Social Connections:   . Frequency of Communication with Friends and Family:   . Frequency of Social Gatherings with Friends and Family:   . Attends Religious Services:   . Active Member of Clubs or Organizations:   . Attends Archivist Meetings:   Marland Kitchen Marital Status:      Family History: The patient's family history includes Hypertension in his father.  ROS:   Please see  the history of present illness.     All other systems reviewed and are negative.  EKGs/Labs/Other Studies Reviewed:      EKG:   April 03, 2019: Sinus bradycardia 58 beats minute.  Voltage criteria for left ventricular hypertrophy.  Recent Labs: 05/16/2019: BUN 22; Creatinine, Ser 0.99; Potassium 4.0; Sodium 135  Recent Lipid Panel    Component Value Date/Time   CHOL 120 10/24/2017 1149   TRIG 59 10/24/2017 1149   HDL 53 10/24/2017 1149   CHOLHDL 2.3 10/24/2017 1149   LDLCALC 55 10/24/2017 1149    Physical Exam:      Physical Exam: Blood pressure 132/64, pulse (!) 50, height 6\' 2"  (1.88 m), weight 179 lb 9.6 oz (81.5 kg), SpO2 98 %.  GEN:  Young , healthy male,  NAD  HEENT: Normal NECK: No JVD; No carotid bruits LYMPHATICS: No lymphadenopathy CARDIAC: RRR , no murmurs, rubs, gallops RESPIRATORY:  Clear to auscultation without rales, wheezing or rhonchi  ABDOMEN: Soft, non-tender, non-distended MUSCULOSKELETAL:  No edema; No deformity  SKIN: Warm and dry NEUROLOGIC:  Alert and oriented x 3   ASSESSMENT:    No diagnosis found. PLAN:       1. Hypertension: Blood pressure is very well controlled.  Continue current medications.  He has a strong family history of hypertension and I think he will need to remain on some blood pressure control.  He is working on watching his diet.  He exercises on a regular basis.  He will be moving to [name] over the next several weeks.  He will need to get a doctor there.  I told him that we will forward his records as needed.   2.  Heart murmur: He has a soft heart murmur on exam.  He has mild mitral regurgitation and trace tricuspid regurgitation. I have instructed him to consider getting another echocardiogram in approximately 10 years.   We will see him on an as-needed basis.   Medication Adjustments/Labs and Tests Ordered: Current medicines are reviewed at length with the patient today.  Concerns regarding medicines are outlined  above.  No orders of the defined types were placed in this encounter.  No orders of the defined types were placed in this encounter.     Patient Instructions  Medication Instructions:  Your physician recommends that you continue on  your current medications as directed. Please refer to the Current Medication list given to you today.  *If you need a refill on your cardiac medications before your next appointment, please call your pharmacy*   Lab Work: None Ordered If you have labs (blood work) drawn today and your tests are completely normal, you will receive your results only by: Marland Kitchen MyChart Message (if you have MyChart) OR . A paper copy in the mail If you have any lab test that is abnormal or we need to change your treatment, we will call you to review the results.   Testing/Procedures: None Ordered   Follow-Up: At [facility], you and your health needs are our priority.  As part of our continuing mission to provide you with exceptional heart care, we have created designated Provider Care Teams.  These Care Teams include your primary Cardiologist (physician) and Advanced Practice Providers (APPs -  Physician Assistants and Nurse Practitioners) who all work together to provide you with the care you need, when you need it.  We recommend signing up for the patient portal called "MyChart".  Sign up information is provided on this After Visit Summary.  MyChart is used to connect with patients for Virtual Visits (Telemedicine).  Patients are able to view lab/test results, encounter notes, upcoming appointments, etc.  Non-urgent messages can be sent to your provider as well.   To learn more about what you can do with MyChart, go to [email or web address].    Your next appointment:    As Needed  The format for your next appointment:   Either In Person or Virtual  Provider:   You may see Kristeen Miss, MD or one of the following Advanced Practice Providers on your designated Care  Team:    Tereso Newcomer, PA-C  Chelsea Aus, [place]         Signed, Kristeen Miss, MD  10/30/2019 6:26 PM    [facility]

## 2019-10-30 NOTE — Patient Instructions (Signed)
Medication Instructions:  Your physician recommends that you continue on your current medications as directed. Please refer to the Current Medication list given to you today.  *If you need a refill on your cardiac medications before your next appointment, please call your pharmacy*   Lab Work: None Ordered If you have labs (blood work) drawn today and your tests are completely normal, you will receive your results only by: . MyChart Message (if you have MyChart) OR . A paper copy in the mail If you have any lab test that is abnormal or we need to change your treatment, we will call you to review the results.   Testing/Procedures: None Ordered   Follow-Up: At [facility], you and your health needs are our priority.  As part of our continuing mission to provide you with exceptional heart care, we have created designated Provider Care Teams.  These Care Teams include your primary Cardiologist (physician) and Advanced Practice Providers (APPs -  Physician Assistants and Nurse Practitioners) who all work together to provide you with the care you need, when you need it.  We recommend signing up for the patient portal called "MyChart".  Sign up information is provided on this After Visit Summary.  MyChart is used to connect with patients for Virtual Visits (Telemedicine).  Patients are able to view lab/test results, encounter notes, upcoming appointments, etc.  Non-urgent messages can be sent to your provider as well.   To learn more about what you can do with MyChart, go to [email or web address].    Your next appointment:    As Needed  The format for your next appointment:   Either In Person or Virtual  Provider:   You may see Philip Nahser, MD or one of the following Advanced Practice Providers on your designated Care Team:    Scott Weaver, PA-C  Vin Bhagat, PA-C     

## 2020-01-24 IMAGING — US US RENAL ARTERY STENOSIS
1 series · 14 of 25 positions shown · non-contrast
Comparison: None.

CLINICAL DATA: 21-year-old male with hypertension. Evaluate for
renal arterial stenosis.

EXAM:
RENAL/URINARY TRACT ULTRASOUND
RENAL DUPLEX DOPPLER ULTRASOUND

[Series 1: us renal artery stenosis · 0.23mm/px · 14 of 75 slices shown]
[im 1/75]
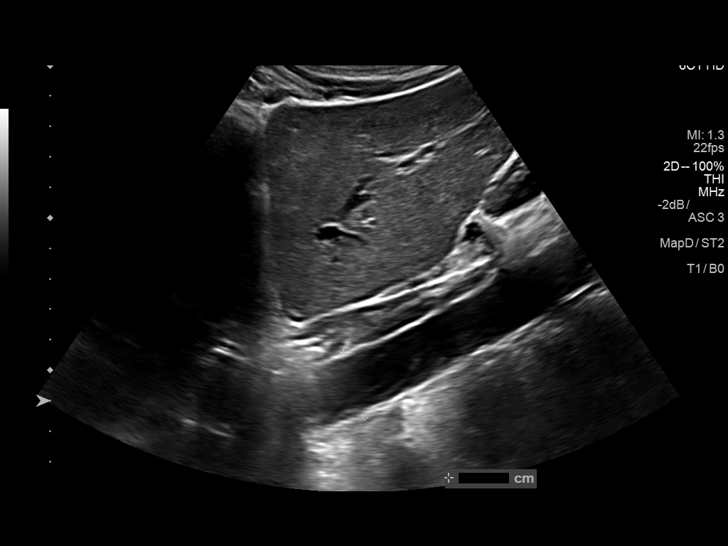
[im 7/75]
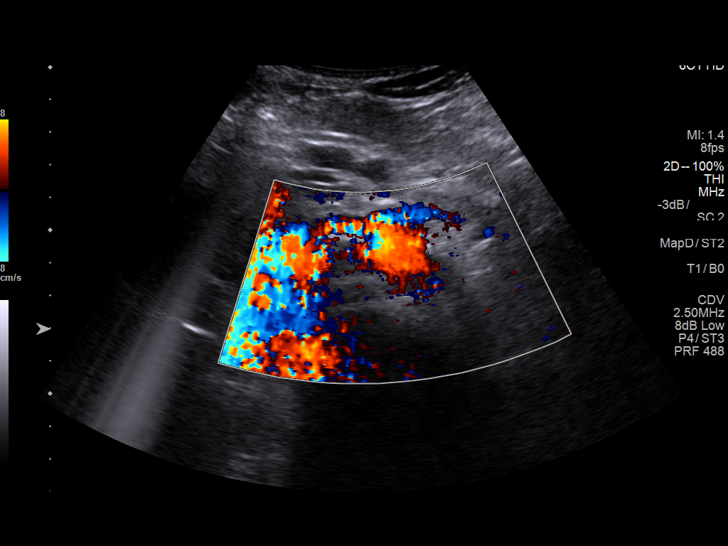
[im 13/75]
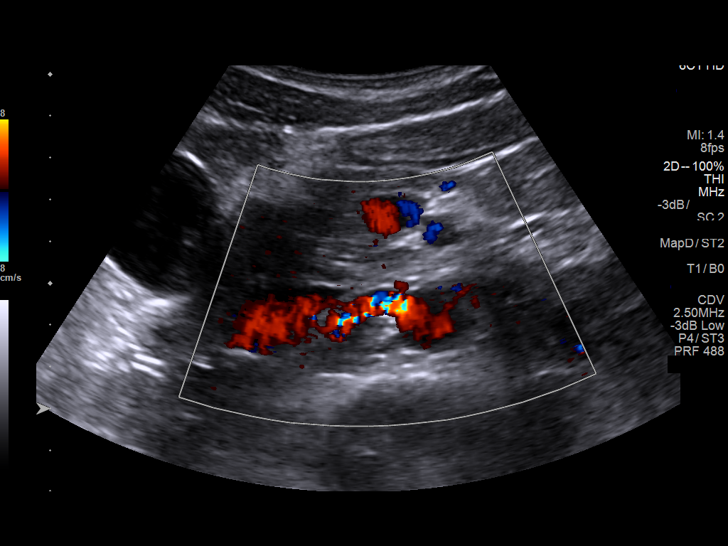
[im 19/75]
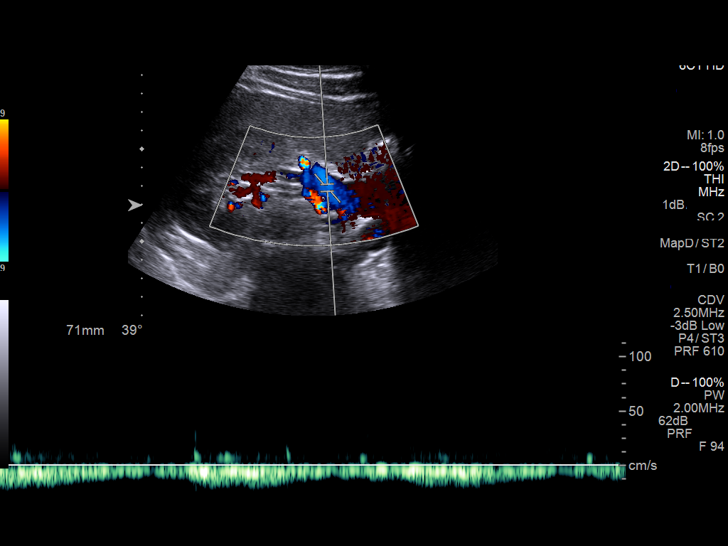
[im 25/75]
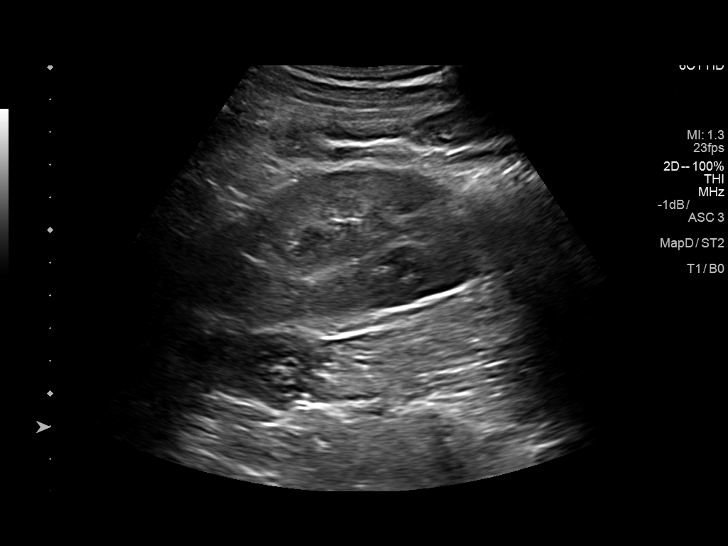
[im 28/75]
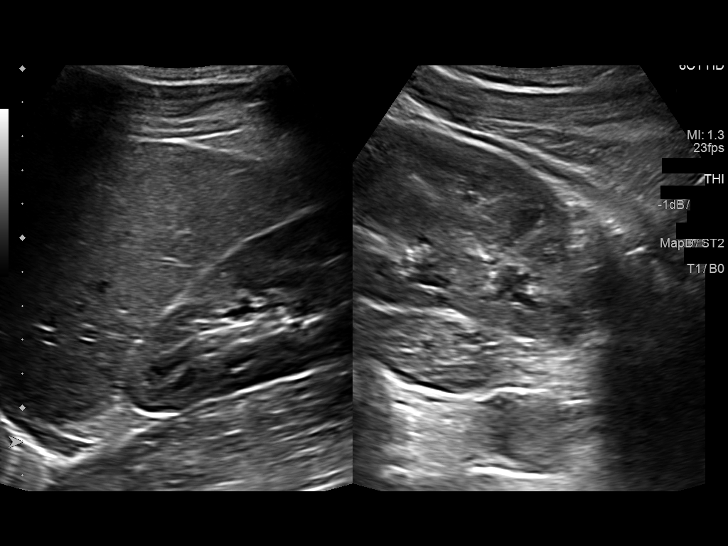
[im 34/75]
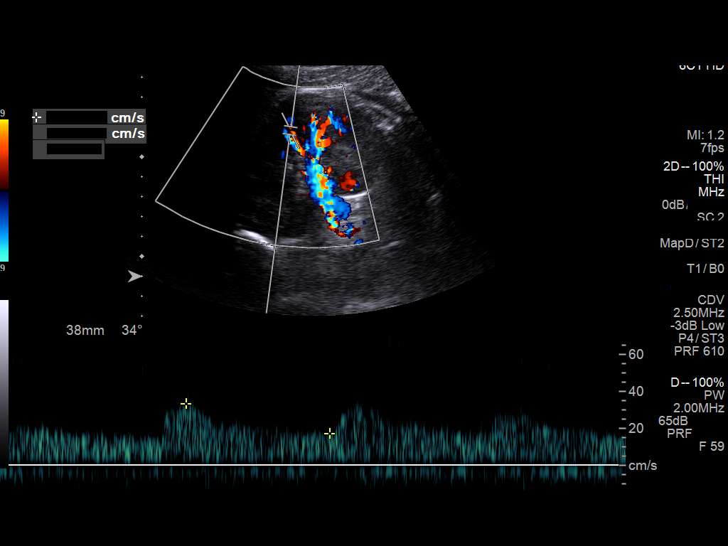
[im 41/75]
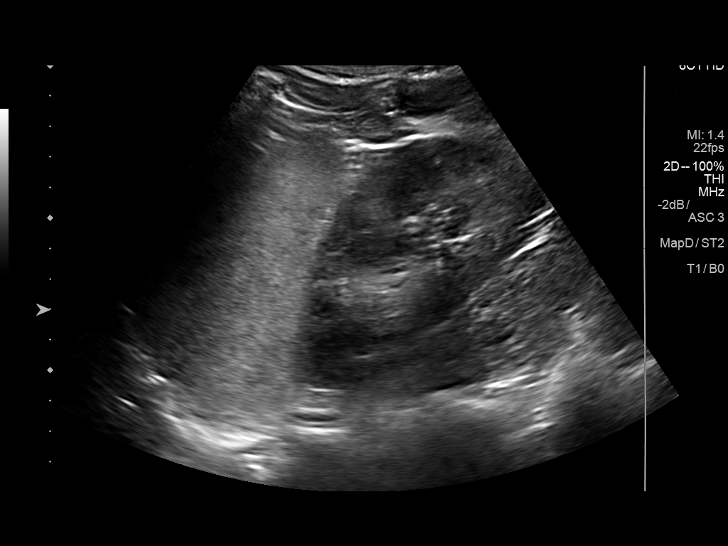
[im 47/75]
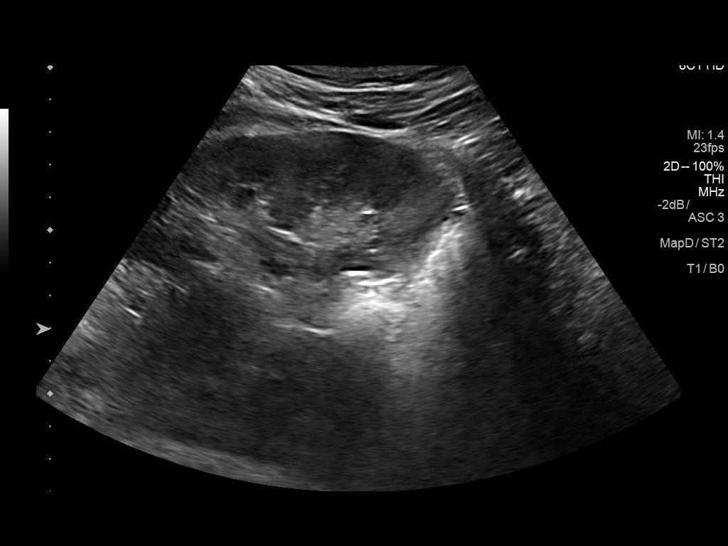
[im 50/75]
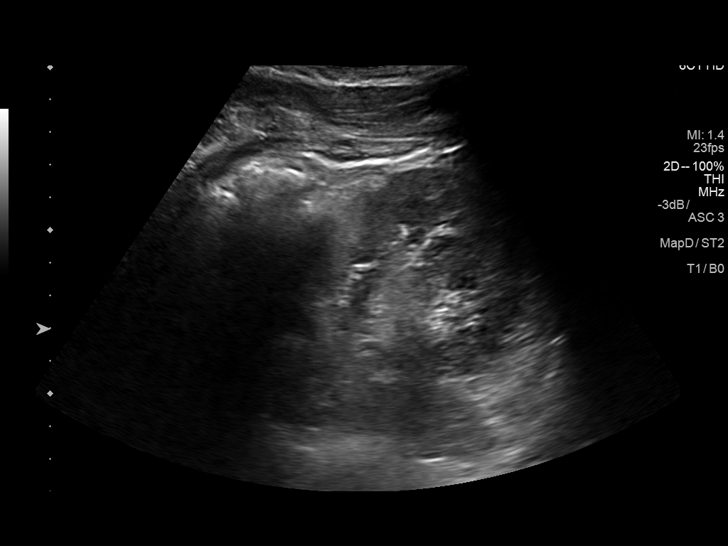
[im 56/75]
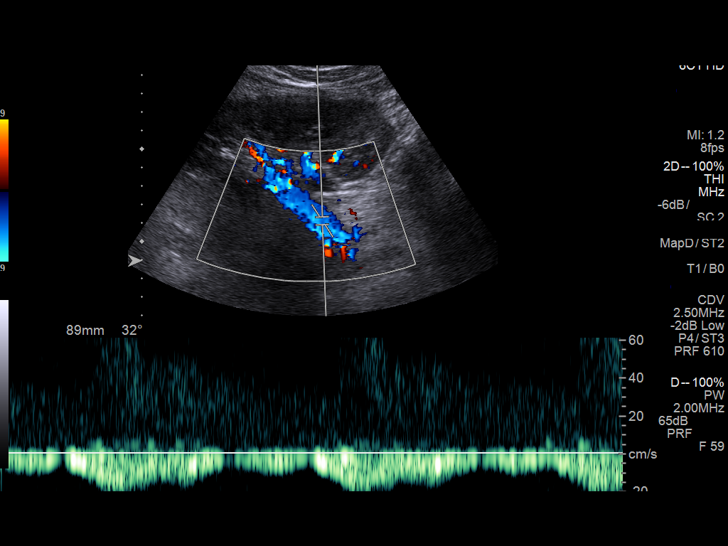
[im 62/75]
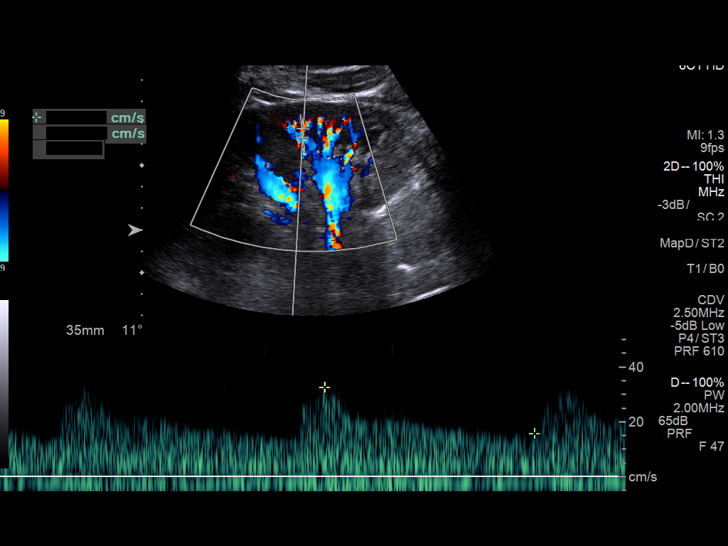
[im 68/75]
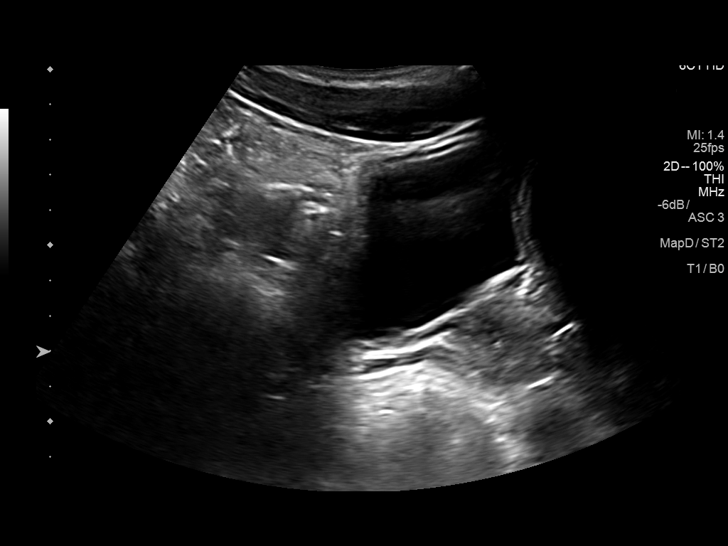
[im 75/75]
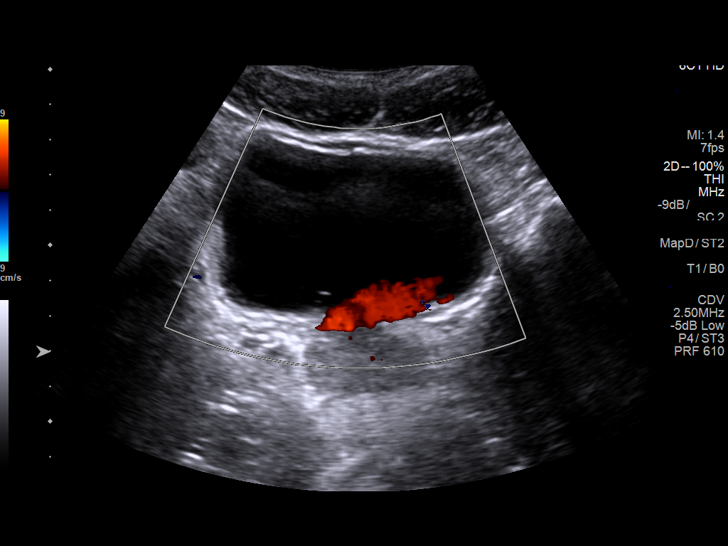

[14 of 25 positions shown; findings below may reference images not displayed]

FINDINGS: Right Kidney:

Length: 12.1 cm. Echogenicity within normal limits. No mass or
hydronephrosis visualized.

Left Kidney:

Length: 12.1 cm. Echogenicity within normal limits. No mass or
hydronephrosis visualized.

Bladder:  Within normal limits.  Both ureteral jets are visualized.

RENAL DUPLEX ULTRASOUND

Right Renal Artery Velocities:

Origin:  121 cm/sec

Mid:  102 cm/sec

Hilum:  108 cm/sec

Interlobar:  52 cm/sec

Arcuate:  33 cm/sec

Left Renal Artery Velocities:

Origin:  118 cm/sec

Mid:  89 cm/sec

Hilum:  85 cm/sec

Interlobar:  46 cm/sec

Arcuate:  37 cm/sec

Aortic Velocity:  145 cm/sec

Right Renal-Aortic Ratios:

Origin:

Mid:

Hilum:

Interlobar:

Arcuate:

Left Renal-Aortic Ratios:

Origin:

Mid:

Hilum:

Interlobar:

Arcuate:

Other: Both the left and right renal vein are visualized and are
patent.
IMPRESSION: 1. No evidence of hemodynamically significant renal arterial
stenosis.
2. Normal sonographic appearance of the kidneys and bladder.

## 2020-06-26 ENCOUNTER — Telehealth: Payer: Self-pay | Admitting: Cardiovascular Disease

## 2020-06-26 ENCOUNTER — Other Ambulatory Visit: Payer: Self-pay

## 2020-06-26 MED ORDER — POTASSIUM CHLORIDE CRYS ER 20 MEQ PO TBCR: 20.0000 meq | EXTENDED_RELEASE_TABLET | Freq: Every day | ORAL | 1 refills | Status: DC

## 2020-06-26 MED ORDER — LOSARTAN POTASSIUM 50 MG PO TABS: 50.0000 mg | ORAL_TABLET | Freq: Every day | ORAL | 1 refills | Status: DC

## 2020-06-26 MED ORDER — HYDROCHLOROTHIAZIDE 25 MG PO TABS: 25.0000 mg | ORAL_TABLET | Freq: Every day | ORAL | 1 refills | Status: DC

## 2020-06-26 NOTE — Telephone Encounter (Signed)
    *  STAT* If patient is at the pharmacy, call can be transferred to refill team.   1. Which medications need to be refilled? (please list name of each medication and dose if known)   hydrochlorothiazide (HYDRODIURIL) 25 MG tablet    losartan (COZAAR) 50 MG tablet    potassium chloride SA (KLOR-CON) 20 MEQ tablet    2. Which pharmacy/location (including street and city if local pharmacy) is medication to be sent to? [id number] [place], [place] - [address]  3. Do they need a 30 day or 90 day supply? 90 days   Pt's dad said pt will call around may or June to schedule his yearly f/u

## 2020-06-26 NOTE — Telephone Encounter (Signed)
Called pt's dad and left message informing him that pt's medications were already sent to pt's pharmacy as requested and if they have any other problems, questions or concerns, to give our office a call back.

## 2020-07-01 MED ORDER — LOSARTAN POTASSIUM 50 MG PO TABS: 50.0000 mg | ORAL_TABLET | Freq: Every day | ORAL | 1 refills | Status: DC

## 2020-07-01 MED ORDER — POTASSIUM CHLORIDE CRYS ER 20 MEQ PO TBCR: 20.0000 meq | EXTENDED_RELEASE_TABLET | Freq: Every day | ORAL | 1 refills | Status: DC

## 2020-07-01 MED ORDER — HYDROCHLOROTHIAZIDE 25 MG PO TABS: 25.0000 mg | ORAL_TABLET | Freq: Every day | ORAL | 1 refills | Status: DC

## 2020-07-01 NOTE — Telephone Encounter (Signed)
[facility] dad, [facility] and he would like these prescriptions sent to [facility].   Address: [address] [place], [place] [address]

## 2020-07-01 NOTE — Addendum Note (Signed)
Addended by: Margaret Pyle D on: 07/01/2020 01:57 PM   Modules accepted: Orders

## 2020-07-01 NOTE — Telephone Encounter (Signed)
Pt's medications were resent to a different pharmacy as requested. Confirmation received.  

## 2020-09-01 ENCOUNTER — Other Ambulatory Visit: Payer: Self-pay

## 2020-09-01 MED ORDER — HYDROCHLOROTHIAZIDE 25 MG PO TABS: 25.0000 mg | ORAL_TABLET | Freq: Every day | ORAL | 0 refills | Status: DC

## 2020-12-01 ENCOUNTER — Other Ambulatory Visit: Payer: Self-pay | Admitting: Cardiovascular Disease

## 2021-05-18 ENCOUNTER — Other Ambulatory Visit: Payer: Self-pay

## 2021-05-18 MED ORDER — HYDROCHLOROTHIAZIDE 25 MG PO TABS: ORAL_TABLET | ORAL | 0 refills | Status: DC

## 2021-05-18 MED ORDER — POTASSIUM CHLORIDE CRYS ER 20 MEQ PO TBCR: 20.0000 meq | EXTENDED_RELEASE_TABLET | Freq: Every day | ORAL | 0 refills | Status: DC

## 2021-05-18 MED ORDER — LOSARTAN POTASSIUM 50 MG PO TABS: 50.0000 mg | ORAL_TABLET | Freq: Every day | ORAL | 0 refills | Status: DC

## 2021-05-18 NOTE — Telephone Encounter (Signed)
Pt's medications were sent to pt's pharmacy as requested. Confirmation received.  

## 2021-05-21 ENCOUNTER — Telehealth: Payer: Self-pay | Admitting: Cardiovascular Disease

## 2021-05-21 NOTE — Telephone Encounter (Signed)
Patient states [place] out of [place] and would like to know if he can have a virtual appointment with Dr. Acie Fredrickson for his refills. Please advise.

## 2021-05-25 NOTE — Telephone Encounter (Signed)
[name], Jesus Ping, MD  Ok to schedule a virtual visit  He will need a recent BP and HR .  He really needs to have some lab work ( BMP) at some point as it has been 2 years since his last blood work .  OK to fill meds now so that he does not run out -     He should be able to establish with a primary care doctor failry soon  who can assume responsibility for his BP meds.   Thanks.   PN   Unable to leave message voicemail box is full.

## 2021-05-28 NOTE — Telephone Encounter (Signed)
Attempted to contact pt and mailbox remains full unable to leave message Will await return call from  pt ./cy

## 2021-06-09 MED ORDER — LOSARTAN POTASSIUM 50 MG PO TABS: 50.0000 mg | ORAL_TABLET | Freq: Every day | ORAL | 0 refills | Status: DC

## 2021-06-09 MED ORDER — POTASSIUM CHLORIDE CRYS ER 20 MEQ PO TBCR: 20.0000 meq | EXTENDED_RELEASE_TABLET | Freq: Every day | ORAL | 0 refills | Status: DC

## 2021-06-09 MED ORDER — HYDROCHLOROTHIAZIDE 25 MG PO TABS: ORAL_TABLET | ORAL | 0 refills | Status: DC

## 2021-06-09 NOTE — Telephone Encounter (Signed)
Nahser, Jesus Ping, MD   Ok to schedule a virtual visit  He will need a recent BP and HR .  He really needs to have some lab work ( BMP) at some point as it has been 2 years since his last blood work .  OK to fill meds now so that he does not run out -     He should be able to establish with a primary care doctor failry soon  who can assume responsibility for his BP meds.  Thanks.  PN    Called and spoke to patient. Informed him that Dr Elease Hashimoto will agree to a virtual visit and refill meds to last until then. Pt states he can check his BP and heart rate prior to this visit. Pt currently seeking a PCP local to him in [place] and understands that this will be necessary in the future. Verified pharmacy to send refills to and sent at this time. Virtual visit scheduled for Dr Harvie Bridge first available (3/13 @4 :20). Pt understands how to access and participate in this visit via MyChart.

## 2021-06-09 NOTE — Addendum Note (Signed)
Addended by: Ma Hillock on: 06/09/2021 12:27 PM   Modules accepted: Orders

## 2021-06-09 NOTE — Telephone Encounter (Signed)
Patient is returning nurses call to schedule virtual visit.

## 2021-07-19 ENCOUNTER — Encounter: Payer: Self-pay | Admitting: Cardiovascular Disease

## 2021-07-19 NOTE — Progress Notes (Signed)
? ?Virtual Visit via Telephone Note  ? ?This visit type was conducted due to national recommendations for restrictions regarding the COVID-19 Pandemic (e.g. social distancing) in an effort to limit this patient's exposure and mitigate transmission in our community.  Due to his co-morbid illnesses, this patient is at least at moderate risk for complications without adequate follow up.  This format is felt to be most appropriate for this patient at this time.  The patient did not have access to video technology/had technical difficulties with video requiring transitioning to audio format only (telephone).  All issues noted in this document were discussed and addressed.  No physical exam could be performed with this format.  Please refer to the patient's chart for his  consent to telehealth for [facility].  ? ? ?Date:  07/19/2021  ? ?ID:  Jesus [name], DOB 07-Mar-1996, MRN [id number] ?The patient was identified using 2 identifiers. ? ?Patient Location: Home ?Provider Location: Office/Clinic ? ? ?PCP:  Fanny Bien, MD ?  ?[facility] Providers ?Cardiologist:  Mertie Moores, MD { ? ?Evaluation Performed:  Follow-Up Visit ? ?Chief Complaint:  HTN ? ? ?History of Present Illness:   ? ?Jesus [name] is a 26 y.o. male with HTN ? ?He moved to [place] this past year and is having this virtual visit due to his location ? ?At his last vist , his BP was very well controlled.  ?He has a very soft murmur,  echo showd mild MR and trace TR. ? 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 ?Doing well ?Enjoying the nice weather .  ? ?BP has been good  ? ?120-135 / 30s - 80  ? ?Exercsing well , 4 times a week .   feeling well  ? ? ?The patient does not have symptoms concerning for COVID-19 infection (fever, chills, cough, or new shortness of breath).  ? ? ?Past Medical History:  ?Diagnosis Date  ? Acute pain of left knee 06/03/2016  ? 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 Elevated blood pressure reading   ?  Hypertension   ? Left Achilles tendinitis 02/14/2019  ? 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 ?Past Surgical History:  ?Procedure Laterality Date  ? KNEE SURGERY    ?  ? ?No outpatient medications have been marked as taking for the 07/20/21 encounter (Office Visit) with Ran Tullis, Wonda Cheng, MD.  ?  ? ?Allergies:   Patient has no known allergies.  ? ?Social History  ? ?Tobacco Use  ? Smoking status: Never  ? Smokeless tobacco: Never  ?Vaping Use  ? Vaping Use: Never used  ?Substance Use Topics  ? Drug use: Never  ?  ? ?Family Hx: ?The patient's family history includes Hypertension in his father. ? ?ROS:   ?Please see the history of present illness.    ? ?All other systems reviewed and are negative. ? ? ?Prior CV studies:   ?The following studies were reviewed today: ? ? ? ?Labs/Other Tests and Data Reviewed:   ? ?EKG:   ? ?Recent Labs: ?No results found for requested labs within last 8760 hours.  ? ?Recent Lipid Panel ?Lab Results  ?Component Value Date/Time  ? CHOL 120 10/24/2017 11:49 AM  ? TRIG 59 10/24/2017 11:49 AM  ? HDL 53 10/24/2017 11:49 AM  ? CHOLHDL 2.3 10/24/2017 11:49 AM  ? LDLCALC 55 10/24/2017 11:49 AM  ? ? ?Wt Readings from Last 3 Encounters:  ?10/30/19 179 lb 9.6 oz (81.5 kg)  ?05/16/19 180 lb (  81.6 kg)  ?04/03/19 180 lb 12.8 oz (82 kg)  ?  ? ?Risk Assessment/Calculations:   ?  ? ?    ?Objective:   ? ?Vital Signs:   BP  130/80,   ?wt is 178,    ?HR :  63 ? ? ? ? ? ?ASSESSMENT & PLAN:   ? ?HTN:   BP is well controlled.  ?BP is well controlled ?He feels great,  is exercising regularly  ? ?Will have him see me or APP in Oct.   ?Will get a BMP at that time  ? ? ?He needs to get a primary care doctor in the [place] area ? ?   ? ?   ? ? ?COVID-19 Education: ?The signs and symptoms of COVID-19 were discussed with the patient and how to seek care for testing (follow up with PCP or arrange E-visit).  The importance of social distancing was discussed today. ? ?Time:   ?Today, I have spent 15  minutes with the patient with  telehealth technology discussing the above problems.   ? ? ?Medication Adjustments/Labs and Tests Ordered: ?Current medicines are reviewed at length with the patient today.  Concerns regarding medicines are outlined above.  ? ?Tests Ordered: ?No orders of the defined types were placed in this encounter. ? ? ?Medication Changes: ?No orders of the defined types were placed in this encounter. ? ? ?Follow Up:  In Person in 7 month(s) ? ?Signed, ?Mertie Moores, MD  ?07/19/2021 10:02 PM    ?[place] ? ? ?

## 2021-07-20 ENCOUNTER — Ambulatory Visit (INDEPENDENT_AMBULATORY_CARE_PROVIDER_SITE_OTHER): Payer: BC Managed Care – PPO | Admitting: Cardiovascular Disease

## 2021-07-20 ENCOUNTER — Encounter: Payer: Self-pay | Admitting: Cardiovascular Disease

## 2021-07-20 ENCOUNTER — Other Ambulatory Visit: Payer: Self-pay

## 2021-07-20 VITALS — BP 124/78 | HR 65 | Ht 74.0 in | Wt 180.0 lb

## 2021-07-20 DIAGNOSIS — I10 Essential (primary) hypertension: Secondary | ICD-10-CM

## 2021-07-20 DIAGNOSIS — I340 Nonrheumatic mitral (valve) insufficiency: Secondary | ICD-10-CM

## 2021-07-20 MED ORDER — HYDROCHLOROTHIAZIDE 25 MG PO TABS: ORAL_TABLET | ORAL | 3 refills | Status: AC

## 2021-07-20 MED ORDER — LOSARTAN POTASSIUM 50 MG PO TABS: 50.0000 mg | ORAL_TABLET | Freq: Every day | ORAL | 3 refills | Status: DC

## 2021-07-20 MED ORDER — POTASSIUM CHLORIDE CRYS ER 20 MEQ PO TBCR: 20.0000 meq | EXTENDED_RELEASE_TABLET | Freq: Every day | ORAL | 3 refills | Status: DC

## 2021-07-20 NOTE — Patient Instructions (Signed)
Medication Instructions:  ?Your physician recommends that you continue on your current medications as directed. Please refer to the Current Medication list given to you today. ? ?*If you need a refill on your cardiac medications before your next appointment, please call your pharmacy* ? ?Lab Work: ?In October 2023: BMP ?If you have labs (blood work) drawn today and your tests are completely normal, you will receive your results only by: ?MyChart Message (if you have MyChart) OR ?A paper copy in the mail ?If you have any lab test that is abnormal or we need to change your treatment, we will call you to review the results. ? ?Testing/Procedures: ?NONE ? ?Follow-Up: ?At [facility], you and your health needs are our priority.  As part of our continuing mission to provide you with exceptional heart care, we have created designated Provider Care Teams.  These Care Teams include your primary Cardiologist (physician) and Advanced Practice Providers (APPs -  Physician Assistants and Nurse Practitioners) who all work together to provide you with the care you need, when you need it. ? ?Your next appointment:   ?We will call to schedule an appointment for October 2023 ? ?The format for your next appointment:   ?In Person ? ?Provider:   ?Kristeen Miss, MD  or Chelsea Aus, PA-C or Tereso Newcomer, [place] ?

## 2021-12-28 ENCOUNTER — Telehealth: Payer: Self-pay | Admitting: Cardiovascular Disease

## 2021-12-28 NOTE — Telephone Encounter (Signed)
Patient calling to set up a scan but I dont see no order in here for it. Please advise

## 2021-12-29 NOTE — Telephone Encounter (Signed)
Returned call to patient, but no answer. Voicemail is full and could not leave message. Will re-attempt later today. Last OV was in march of this year, done virtually, due to patient living in [place]. No imaging order mentioned or repeat advised from last ECHO.

## 2021-12-30 NOTE — Telephone Encounter (Signed)
Attempted second return call to patient. Again, no answer and voicemail box is still full and cannot accept messages. Not an active MyChart user.

## 2021-12-31 NOTE — Telephone Encounter (Signed)
Called pt mailbox full unable to leave a message.

## 2021-12-31 NOTE — Telephone Encounter (Signed)
Patient returning call.

## 2021-12-31 NOTE — Telephone Encounter (Signed)
Spoke with pt who states he would like to schedule his 65mo f/u and any testing with Dr Elease Hashimoto.  Pt continues to reside in [place].  Appointment scheduled for 02/23/2022.  Pt will be flying in on 02/22/2022.  Pt advised I do not see mention of testing prior to his appointment in Dr Harvie Bridge last OV note other than a BMP but will forward to Dr Harvie Bridge RN for review.

## 2022-01-13 NOTE — Telephone Encounter (Signed)
MD will address and order any needed imaging during upcoming appt. Last OV was in march of this year, done virtually, due to patient living in [place]. No imaging order mentioned or repeat advised from last ECHO.

## 2022-02-23 ENCOUNTER — Ambulatory Visit: Payer: BC Managed Care – PPO | Attending: Cardiovascular Disease | Admitting: Cardiovascular Disease

## 2022-02-23 ENCOUNTER — Encounter: Payer: Self-pay | Admitting: Cardiovascular Disease

## 2022-02-23 VITALS — BP 134/70 | HR 67 | Ht 74.0 in | Wt 184.0 lb

## 2022-02-23 DIAGNOSIS — I10 Essential (primary) hypertension: Secondary | ICD-10-CM | POA: Diagnosis not present

## 2022-02-23 DIAGNOSIS — I340 Nonrheumatic mitral (valve) insufficiency: Secondary | ICD-10-CM | POA: Diagnosis not present

## 2022-02-23 MED ORDER — LOSARTAN POTASSIUM 100 MG PO TABS: 100.0000 mg | ORAL_TABLET | Freq: Every day | ORAL | 3 refills | Status: AC

## 2022-02-23 NOTE — Progress Notes (Signed)
Cardiology Office Note:    Date:  02/23/2022   ID:  Jesus [name], DOB 1995-12-10, MRN [id number]  PCP:  Lewis Moccasin, MD   [facility] Providers Cardiologist:  Kristeen Miss, MD {    Referring MD: Lewis Moccasin, MD   Chief Complaint  Patient presents with   Hypertension          History of Present Illness:   Oct. 17, 2023    Jesus [name] is a 26 y.o. male with a hx of HTN.  He moved to [place] a year ago . 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  Still eats salty foods - typical for 26 yo Beer on the weekends  Measures his BP on occasion.  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   potassium chloride SA (KLOR-CON M) 20 MEQ tablet Take 1 tablet (20 mEq total) by mouth daily.   [DISCONTINUED] losartan (COZAAR) 50 MG tablet Take 1 tablet (50 mg total) by mouth daily.     Allergies:   Patient has no known allergies.   Social History   Socioeconomic History   Marital status: Single    Spouse name: Not on file   Number of children: Not on file   Years of education: Not on file   Highest education level: Not on file  Occupational History   Not on file  Tobacco Use   Smoking status: Never   Smokeless tobacco: Never  Vaping Use   Vaping Use: Never used  Substance and Sexual Activity    Alcohol use: Not on file   Drug use: Never   Sexual activity: Not on file  Other Topics Concern   Not on file  Social History Narrative   Not on file   Social Determinants of Health   Financial Resource Strain: Not on file  Food Insecurity: Not on file  Transportation Needs: Not on file  Physical Activity: Not on file  Stress: Not on file  Social Connections: Not on file     Family History: The patient's family history includes Hypertension in his father.  ROS:   Please see the history of present illness.     All other systems reviewed and are negative.  EKGs/Labs/Other Studies Reviewed:    The following studies were reviewed today:   EKG:   February 23, 2022: Normal sinus rhythm at 67.  Voltage criteria for left ventricular perjury.  I suspect it is because of this he is thin chest wall.  Recent Labs: No results found for requested labs within last 365 days.  Recent Lipid Panel    Component Value Date/Time   CHOL 120 10/24/2017 1149   TRIG 59 10/24/2017 1149   HDL 53  10/24/2017 1149   CHOLHDL 2.3 10/24/2017 1149   LDLCALC 55 10/24/2017 1149     Risk Assessment/Calculations:                Physical Exam:    VS:  BP 134/70   Pulse 67   Ht 6\' 2"  (1.88 m)   Wt 184 lb (83.5 kg)   SpO2 98%   BMI 23.62 kg/m     Wt Readings from Last 3 Encounters:  02/23/22 184 lb (83.5 kg)  07/20/21 180 lb (81.6 kg)  10/30/19 179 lb 9.6 oz (81.5 kg)     GEN:  Well nourished, well developed in no acute distress HEENT: Normal NECK: No JVD; No carotid bruits LYMPHATICS: No lymphadenopathy CARDIAC: RRR, no murmurs, rubs, gallops RESPIRATORY:  Clear to auscultation without rales, wheezing or rhonchi  ABDOMEN: Soft, non-tender, non-distended MUSCULOSKELETAL:  No edema; No deformity  SKIN: Warm and dry NEUROLOGIC:  Alert and oriented x 3 PSYCHIATRIC:  Normal affect   ASSESSMENT:    1. Essential hypertension   2. Nonrheumatic mitral valve regurgitation    PLAN:     In order of problems listed above:  HTN: Blood pressure has been a little bit on the elevated side.  He still eats fairly typical young man's diet consisting of salty and fast foods.  We will increase his losartan to 50 mg a day.  We will have him get a basic metabolic profile at a [facility] when he is back in [place] in several weeks. Encouraged him to keep a blood pressure log.  We will plan on seeing him again in a year.  We will cancel that appointment if he finds a primary medical doctor or cardiologist to see in [place].  Overall he seems to be doing very well.            Medication Adjustments/Labs and Tests Ordered: Current medicines are reviewed at length with the patient today.  Concerns regarding medicines are outlined above.  Orders Placed This Encounter  Procedures   Basic metabolic panel   Basic metabolic panel   EKG 29-BMWU   Meds ordered this encounter  Medications   losartan (COZAAR) 100 MG tablet    Sig: Take 1 tablet (100 mg total) by mouth daily.    Dispense:  90 tablet    Refill:  3    Patient Instructions  Medication Instructions:  INCREASE Losartan to 100mg  daily *If you need a refill on your cardiac medications before your next appointment, please call your pharmacy*   Lab Work: BMET today BMET in 3 weeks ([facility]) If you have labs (blood work) drawn today and your tests are completely normal, you will receive your results only by: [place] (if you have MyChart) OR A paper copy in the mail If you have any lab test that is abnormal or we need to change your treatment, we will call you to review the results.   Testing/Procedures: NONE   Follow-Up: At [facility], you and your health needs are our priority.  As part of our continuing mission to provide you with exceptional heart care, we have created designated Provider Care Teams.  These Care Teams include your primary Cardiologist (physician) and Advanced Practice Providers (APPs -   Physician Assistants and Nurse Practitioners) who all work together to provide you with the care you need, when you need it.  Your next appointment:   1 year(s)  The format for your next appointment:   In Person  Provider:   Kristeen Miss, MD       Important Information About Sugar         Signed, Kristeen Miss, MD  02/23/2022 6:04 PM    [facility]

## 2022-02-23 NOTE — Patient Instructions (Addendum)
Medication Instructions:  INCREASE Losartan to 100mg  daily *If you need a refill on your cardiac medications before your next appointment, please call your pharmacy*   Lab Work: BMET today BMET in 3 weeks ([facility]) If you have labs (blood work) drawn today and your tests are completely normal, you will receive your results only by: [place] (if you have MyChart) OR A paper copy in the mail If you have any lab test that is abnormal or we need to change your treatment, we will call you to review the results.   Testing/Procedures: NONE   Follow-Up: At [facility], you and your health needs are our priority.  As part of our continuing mission to provide you with exceptional heart care, we have created designated Provider Care Teams.  These Care Teams include your primary Cardiologist (physician) and Advanced Practice Providers (APPs -  Physician Assistants and Nurse Practitioners) who all work together to provide you with the care you need, when you need it.  Your next appointment:   1 year(s)  The format for your next appointment:   In Person  Provider:   Mertie Moores, MD       Important Information About Sugar

## 2022-02-24 LAB — BASIC METABOLIC PANEL
BUN/Creatinine Ratio: 19 (ref 9–20)
BUN: 21 mg/dL — ABNORMAL HIGH (ref 6–20)
CO2: 25 mmol/L (ref 20–29)
Calcium: 9.9 mg/dL (ref 8.7–10.2)
Chloride: 98 mmol/L (ref 96–106)
Creatinine, Ser: 1.1 mg/dL (ref 0.76–1.27)
Glucose: 102 mg/dL — ABNORMAL HIGH (ref 70–99)
Potassium: 3.8 mmol/L (ref 3.5–5.2)
Sodium: 136 mmol/L (ref 134–144)
eGFR: 96 mL/min/{1.73_m2} (ref >59)

## 2022-03-30 DIAGNOSIS — I10 Essential (primary) hypertension: Secondary | ICD-10-CM | POA: Diagnosis not present

## 2022-03-30 DIAGNOSIS — I340 Nonrheumatic mitral (valve) insufficiency: Secondary | ICD-10-CM | POA: Diagnosis not present

## 2022-03-31 ENCOUNTER — Encounter: Payer: Self-pay | Admitting: Cardiovascular Disease

## 2022-03-31 LAB — BASIC METABOLIC PANEL
BUN/Creatinine Ratio: 18 (ref 9–20)
BUN: 19 mg/dL (ref 6–20)
CO2: 25 mmol/L (ref 20–29)
Calcium: 10.5 mg/dL — ABNORMAL HIGH (ref 8.7–10.2)
Chloride: 97 mmol/L (ref 96–106)
Creatinine, Ser: 1.07 mg/dL (ref 0.76–1.27)
Glucose: 86 mg/dL (ref 70–99)
Potassium: 4.3 mmol/L (ref 3.5–5.2)
Sodium: 137 mmol/L (ref 134–144)
eGFR: 99 mL/min/{1.73_m2} (ref >59)

## 2022-04-28 ENCOUNTER — Telehealth: Payer: Self-pay | Admitting: Cardiovascular Disease

## 2022-04-28 NOTE — Telephone Encounter (Signed)
Pt c/o BP issue: STAT if pt c/o blurred vision, one-sided weakness or slurred speech  1. What are your last 5 BP readings?    Caller stated patient has readings.  Within the last 15 min - 180/120  2. Are you having any other symptoms (ex. Dizziness, headache, blurred vision, passed out)?   Dizziness  3. What is your BP issue?   Father stated the patient said he had more caffeine that usual today. Father stated patient can be called directly to [phone] .

## 2022-04-28 NOTE — Telephone Encounter (Signed)
Spoke with patient who states he has elevated BP of 187/120. He reports having more caffeine than usual, about 300 mg today. He denies CP and SOB. Reviewed with Dr Graciela Husbands, (DOD). Advised patient to take an additional 50 mg of losartan. Avoid caffeine and rest today. Recheck BP in a few hours. If blood pressures do not normalize he should see urgent medical care.  Additional recommendation is to follow up with a hypertension clinic in the [place] area for better long term management.  Patient verbalized understanding.

## 2022-04-29 ENCOUNTER — Encounter: Payer: Self-pay | Admitting: Cardiovascular Disease

## 2022-05-14 ENCOUNTER — Encounter: Payer: Self-pay | Admitting: Cardiovascular Disease

## 2022-06-17 ENCOUNTER — Telehealth: Payer: Self-pay | Admitting: Cardiovascular Disease

## 2022-06-17 NOTE — Telephone Encounter (Signed)
Patient's father called and mentioned that patient has been struggling with high blood pressure. Patient been having dizzy spells. Patient is currently on Amlodipine 5MG  from Dr. Ophelia Charter in [place], [place]. Please call back to discuss

## 2022-06-17 NOTE — Telephone Encounter (Signed)
Patient stated he has relocated to [place], [place] and he currently under the care of another Cardiologist in [place]. However, he is currently symptomatic,  experiencing dizziness for the past 2 months and elevated bp 160/80, pulse 115 (this morning). Patient wanted to know if Dr. Acie Fredrickson could offer some medical advise pertaining to his symptoms. Asked patient if he contact his current Cardiologist, he said no. Advised to call this Cardiologist . Also, advised patient  to call 911 or seek medical attention if develop new or worsen symptoms. Will forward to MD and nurse.

## 2022-06-18 NOTE — Telephone Encounter (Signed)
Nahser, Wonda Cheng, MD  Cv Div [facility] hours ago (9:15 PM)   I saw him in Oct so he should be considered a current patient If his BP and HR are still elevated I would like him to start metoprolol 25 mg po BID In addition to his other meds.  Ask him to call us back in a week with more HR , BP info after starting the metoprolol Make sure he is avoiding salt and salty foods  PN   Returned call to patient who states that his HR has been good today. He feels he's having a lot of anxiety lately d/t job stressors and recent move. States he would like to hold off on starting medication at this time. He has appt on Tuesday 06/22/22 with new cardiologist and will call us back if he feels he needs something after that.

## 2022-08-18 ENCOUNTER — Other Ambulatory Visit: Payer: Self-pay

## 2022-08-18 MED ORDER — POTASSIUM CHLORIDE CRYS ER 20 MEQ PO TBCR: 20.0000 meq | EXTENDED_RELEASE_TABLET | Freq: Every day | ORAL | 1 refills | Status: AC
